# Patient Record
Sex: Male | Born: 1955 | Race: White | Hispanic: No | Marital: Married
Health system: Midwestern US, Academic
[De-identification: ages and names within clinical notes are randomized; demographics above are authoritative.]

---

## 2016-07-14 ENCOUNTER — Encounter: Admit: 2016-07-14 | Discharge: 2016-07-14 | Payer: BC Managed Care – PPO

## 2016-09-01 ENCOUNTER — Encounter: Admit: 2016-09-01 | Discharge: 2016-09-01 | Payer: BC Managed Care – PPO

## 2016-09-01 DIAGNOSIS — R69 Illness, unspecified: Principal | ICD-10-CM

## 2016-09-09 ENCOUNTER — Encounter: Admit: 2016-09-09 | Discharge: 2016-09-09 | Payer: BC Managed Care – PPO

## 2016-11-23 ENCOUNTER — Encounter: Admit: 2016-11-23 | Discharge: 2016-11-23 | Payer: BC Managed Care – PPO

## 2016-11-23 DIAGNOSIS — R52 Pain, unspecified: Principal | ICD-10-CM

## 2016-11-25 ENCOUNTER — Ambulatory Visit: Admit: 2016-11-25 | Discharge: 2016-11-25 | Payer: BC Managed Care – PPO

## 2016-11-25 ENCOUNTER — Encounter: Admit: 2016-11-25 | Discharge: 2016-11-25 | Payer: BC Managed Care – PPO

## 2016-11-25 ENCOUNTER — Ambulatory Visit: Admit: 2016-11-25 | Discharge: 2016-11-26 | Payer: BC Managed Care – PPO

## 2016-11-25 DIAGNOSIS — M19039 Primary osteoarthritis, unspecified wrist: Principal | ICD-10-CM

## 2016-11-25 DIAGNOSIS — M549 Dorsalgia, unspecified: Principal | ICD-10-CM

## 2016-11-25 DIAGNOSIS — R52 Pain, unspecified: Principal | ICD-10-CM

## 2016-11-25 NOTE — Progress Notes
Name: Gary Medina Age: 61 y.o.     Occupation/Hobbies: Manufacturing   Dominant Hand: Right    Referring Physician: Steva Ready, MD  Primary Care Physician: Steva Ready    Reason for Appointment: Patient is here for Right Wrist. Patient started having problems when he fell In may 2018. Patient had Xray done. Patient just took ibuprofen for 2 weeks. Patient is here to see what lese can be done to improve his wrist  Date of Injury/Duration:     Mechanism of Injury:     Treatment to Date/Studies:     Surgical History:    Past Surgical History:   Procedure Laterality Date    KNEE SURGERY         Medical History:    Past Medical History:   Diagnosis Date    Back pain        Family History:    Family History   Problem Relation Age of Onset    Cancer Mother     Cancer Father        Current Medications:    No outpatient encounter prescriptions on file as of 11/25/2016.     No facility-administered encounter medications on file as of 11/25/2016.        Allergies:    Patient has no allergy information on record.      Date of Service: 11/25/2016    Subjective:                History of Present Illness    Gary Medina is a 61 y.o. male.     Review of Systems   Constitutional: Positive for activity change.   Musculoskeletal: Positive for arthralgias and joint swelling.   All other systems reviewed and are negative.        Objective:         No current outpatient prescriptions on file.     Vitals:    11/25/16 1058   BP: 133/78   Pulse: 92   Height: 185.4 cm (73")     There is no height or weight on file to calculate BMI.     Physical Exam  Ortho Exam         Assessment and Plan:

## 2016-11-26 NOTE — Progress Notes
Chief Complaint   Patient presents with   ??? Wrist Injury     Right        History of Present Illness:   [name] is a 61 year old right-hand-dominant male who presents today for evaluation of his right wrist.  She reports that he probably had an injury to his wrist roughly 5 years ago which was treated conservatively.  In the past year he reports that he has had worsening wrist pain after a small fall.  Since that time he has noticed a moderate amount of swelling about the radial side of his wrist along with pain with wrist flexion and extension.  Despite the pain and swelling patient is still able to work and finds a discomfort only minimally bothersome.  He occasionally takes anti-inflammatories for the pain.    The following portions of the patient's history were reviewed and updated as appropriate.  Past Medical History:     Past Medical History:   Diagnosis Date   ??? Back pain        Past Surgical History:     Past Surgical History:   Procedure Laterality Date   ??? KNEE SURGERY         Family History:  Family History   Problem Relation Age of Onset   ??? Cancer Mother    ??? Cancer Father      Social History:   Social History     Social History   ??? Marital status: Married     Spouse name: N/A   ??? Number of children: N/A   ??? Years of education: N/A     Social History Main Topics   ??? Smoking status: Former Smoker     Quit date: 11/25/1992   ??? Smokeless tobacco: Never Used   ??? Alcohol use 1.2 oz/week     2 Cans of beer per week   ??? Drug use: No   ??? Sexual activity: Not on file     Other Topics Concern   ??? Not on file     Social History Narrative   ??? No narrative on file       Current Medications: currently has no medications in their medication list.    Allergies: has no allergies on file.    Review of Systems:  Review of Systems  General Physical Exam:  General/Constitutional:No apparent distress: well-nourished and well developed.  Eyes: Sclera nonicteric, conjunctiva clear Respiratory:No shortness of breath or dyspnea  Cardiac: No clubbing, cyanosis, or edema  Vascular: No edema, swelling or tenderness, except as noted in detailed exam.  Integumentary:No impressive skin lesions present, except as noted in detailed exam.  Neuro/Psych: Normal mood and affect, oriented to person, place and time.  Musculoskeletal: Normal, except as noted in detailed exam and in HPI.      Focused Physical Examination:  BP 133/78  - Pulse 92  - Ht 185.4 cm (73)     Examination of right wrist: There is a significant amount of swelling along the radius scaphoid region.  With some tenderness to palpation.  There is limited range of motion of the wrist with 40 degrees of dorsiflexion and roughly 10 degrees of wrist flexion.  Sensation is intact to light touch over the median ulnar and radial nerve distribution.  The hand is warm and well perfused with a palpable radial pulse.      Imaging: Radiographs of the right wrist were reviewed showing significant degenerative changes about the radial scaphoid joint consistent with a  SLAC arthropathy.    Assessment and Plan:    Mr. 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    I discussed the above findings in detail with the patient today and spoke about the natural history of this type of arthritis. It is likely to be progressive and may lead to more discomfort and decrease in function. We discussed both operative and non-operative alternatives in detail today.  In accordance with the patient???s wishes during the shared decision making I agree that non-surgical treatment is an acceptable option in this situation considering the minimal discomfort the above pathology, and activity level. Given the duration of symptoms splinting and activity modification may be of benefit. Also, non-narcotic pain and anti-inflammatory management guided by the patient???s primary care physician may help the symptoms.  Operative interventions were discussed with the patient that if the discomfort in his wrist is to worsen to the level that he is unable to perform his daily activities he could consider a partial wrist fusion consisting of a scaphoid excision and 4 corner fusion versus a complete wrist fusion.  The risk benefits and alternatives were discussed with the patient and again at this time he would like to proceed with continued nonsurgical management.  If any problems or worsening symptoms should arise, the patient will contact my office as soon as possible for a more immediate evaluation.

## 2018-12-21 IMAGING — CT Pelvis^CONFORMIS_NEW (Adult)
3 series · 11 of 27 positions shown, 13 images · non-contrast
Comparison: none

[Series 2: hip 2.0 b60s · axial · 0.59mm/px · z∈[-87,-33]mm · 5 of 39 slices shown, 6 images]
[im 7/39  soft-tissue]
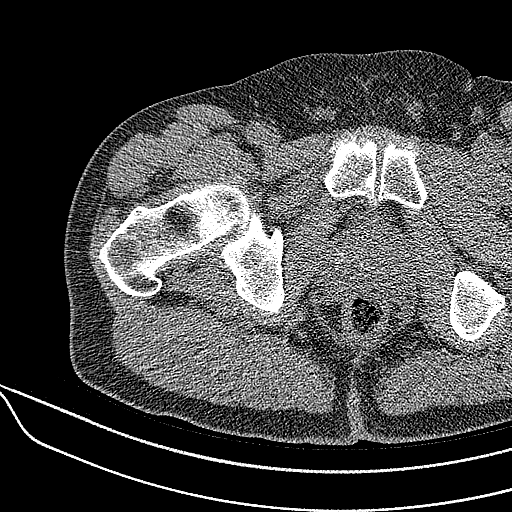
[im 7/39  bone]
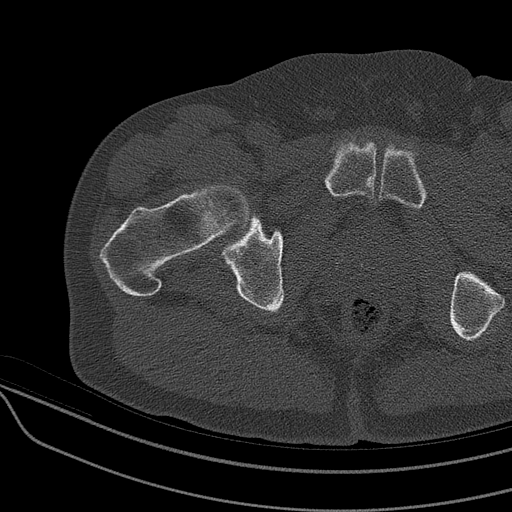
[im 13/39  soft-tissue]
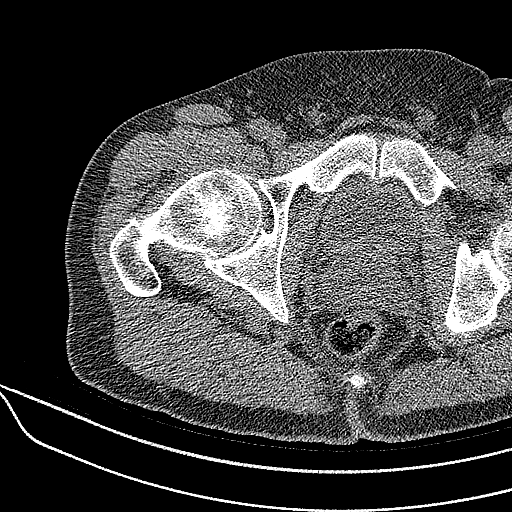
[im 20/39  soft-tissue]
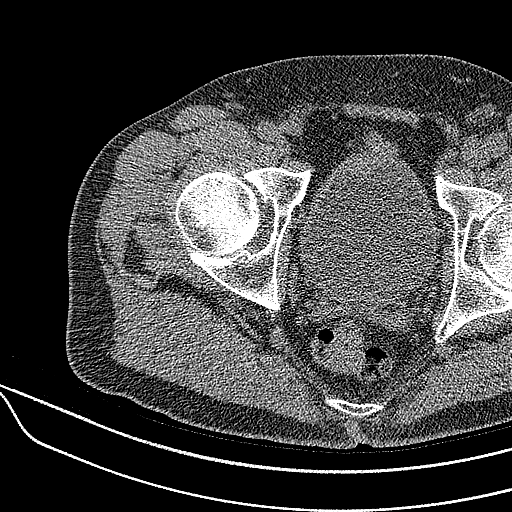
[im 26/39  soft-tissue]
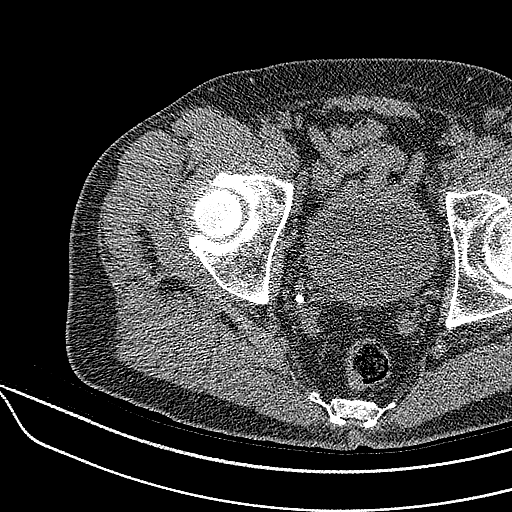
[im 32/39  soft-tissue]
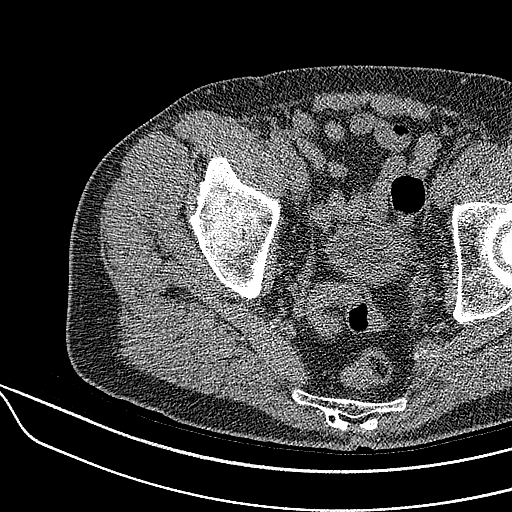

[Series 4: ankle 2.0 b60s · axial · 0.39mm/px · z∈[-970,-912]mm · 5 of 42 slices shown]
[im 7/42  soft-tissue]
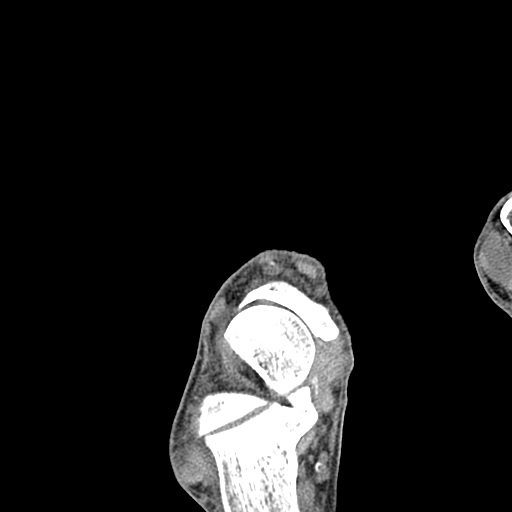
[im 14/42  soft-tissue]
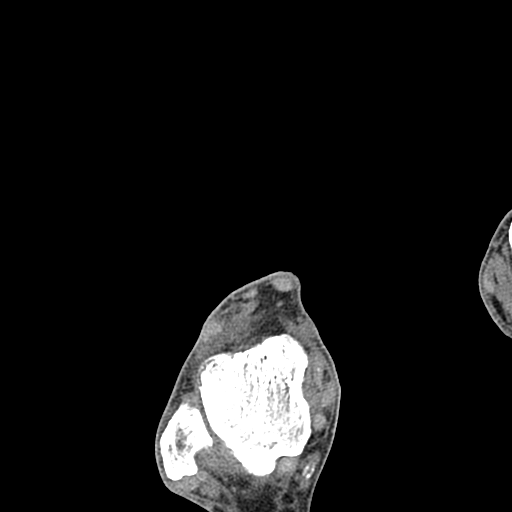
[im 21/42  soft-tissue]
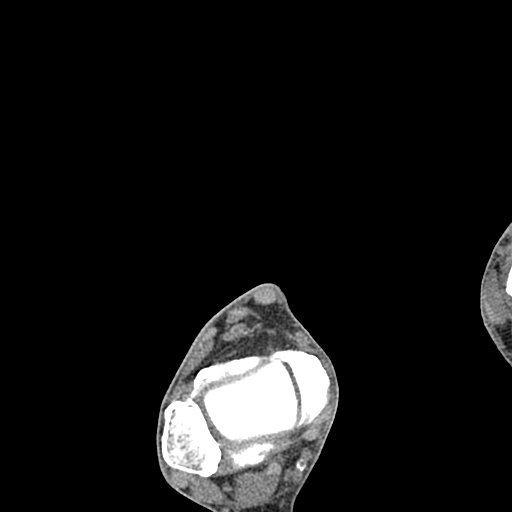
[im 28/42  soft-tissue]
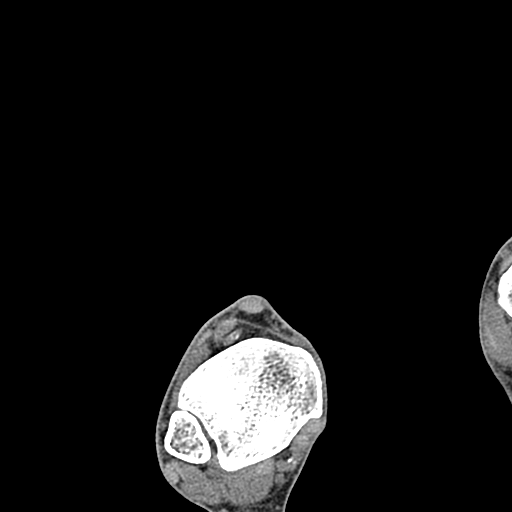
[im 35/42  soft-tissue]
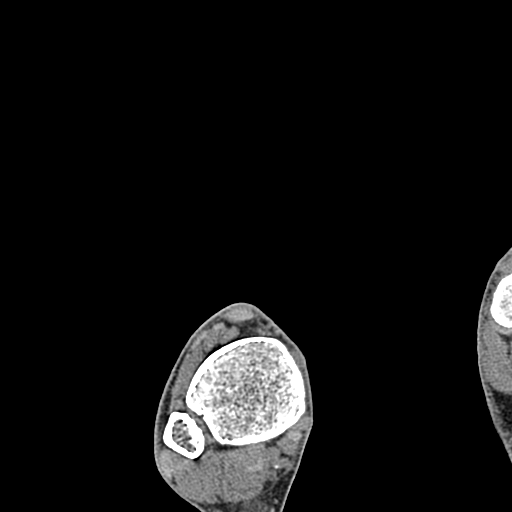

[Series 603: knee sagittal · sagittal · 0.49mm/px · 1 of 119 slices shown, 2 images]
[im 60/119  soft-tissue]
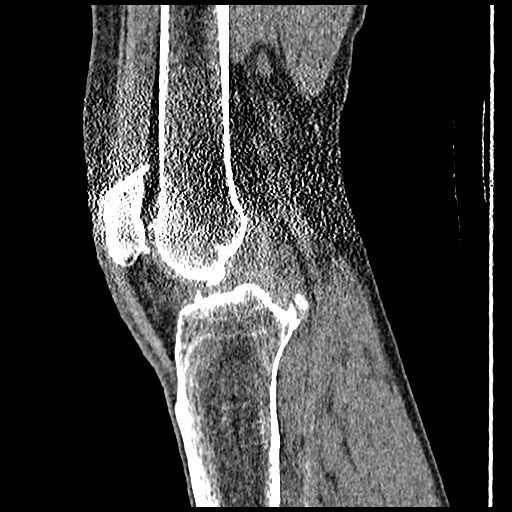
[im 60/119  bone]
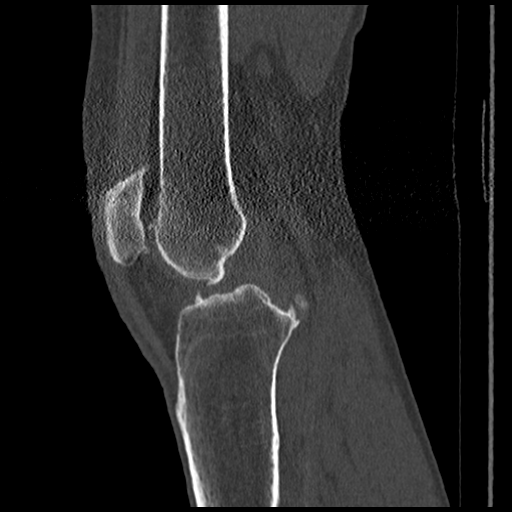

[11 of 27 positions shown; findings below may reference images not displayed]

DIAGNOSTIC STUDIES

EXAM

CT right knee conform is protocol without contrast.

INDICATION

Right knee osteoarthritis

TECHNIQUE

Contiguous transaxial imaging through the right knee was performed in the absence of intravenous
iodinated contrast material. Coronal and sagittal reformations were obtained the transaxial source
data by the CT technologist the workstation. Limited axial imaging through the right hip and ankle
were also obtained.

All CT scans at this facility use dose modulation, iterative reconstruction, and/or weight based
dosing when appropriate to reduce radiation dose to as low as reasonably achievable.

COMPARISONS

Bilateral knee radiographs June 24, 2016.

FINDINGS

Right knee osteoarthritis is evident. No acute bony or soft tissue abnormality of the visualized
pelvis.

No acute bony or soft tissue abnormality of the visualized right ankle.

Severe tricompartmental osteoarthritis is evident including tricompartmental osteophytes and bone-
on-bone medial compartment articulation with joint surface sclerosis and cystic change. There is
slight Jusef Verus from the joint space loss. No destructive bone lesion, acute fracture, or
malalignment. There is also osteoarthritis of the proximal tibiofibular articulation. Muscle bulk
is adequate. No soft tissue mass or fluid collection. Chondrocalcinosis is evident.

IMPRESSION

Severe tricompartmental osteoarthritis, greatest medial compartment.
